# Patient Record
Sex: Male | Born: 1975 | Race: White | Hispanic: No | Marital: Married | Smoking: Former smoker
Health system: Southern US, Community
[De-identification: ages and names within clinical notes are randomized; demographics above are authoritative.]

---

## 1998-12-17 ENCOUNTER — Encounter: Payer: Self-pay | Admitting: Specialist

## 1998-12-17 ENCOUNTER — Ambulatory Visit (HOSPITAL_COMMUNITY): Admission: RE | Admit: 1998-12-17 | Discharge: 1998-12-17 | Payer: Self-pay | Admitting: Specialist

## 2012-10-22 ENCOUNTER — Other Ambulatory Visit: Payer: Self-pay | Admitting: Family Medicine

## 2012-10-22 DIAGNOSIS — N508 Other specified disorders of male genital organs: Secondary | ICD-10-CM

## 2012-10-29 ENCOUNTER — Other Ambulatory Visit: Payer: Self-pay

## 2012-10-29 ENCOUNTER — Ambulatory Visit
Admission: RE | Admit: 2012-10-29 | Discharge: 2012-10-29 | Disposition: A | Discharge status: Home / Self Care | Payer: BC Managed Care – PPO | Source: Ambulatory Visit | Attending: Family Medicine | Admitting: Family Medicine

## 2012-10-29 DIAGNOSIS — N508 Other specified disorders of male genital organs: Secondary | ICD-10-CM

## 2013-08-27 IMAGING — US US SCROTUM
1 series · 14 of 25 positions shown · non-contrast
Comparison: none

Scrotal ultrasound
HISTORY: Palpable fullness right scrotum

[Series 1: us scrotum · 0.10mm/px · 14 of 37 slices shown]
[im 1/37]
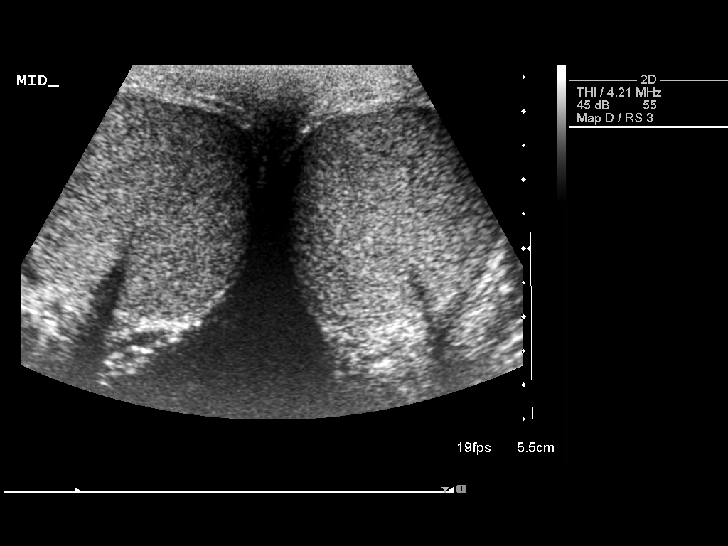
[im 4/37]
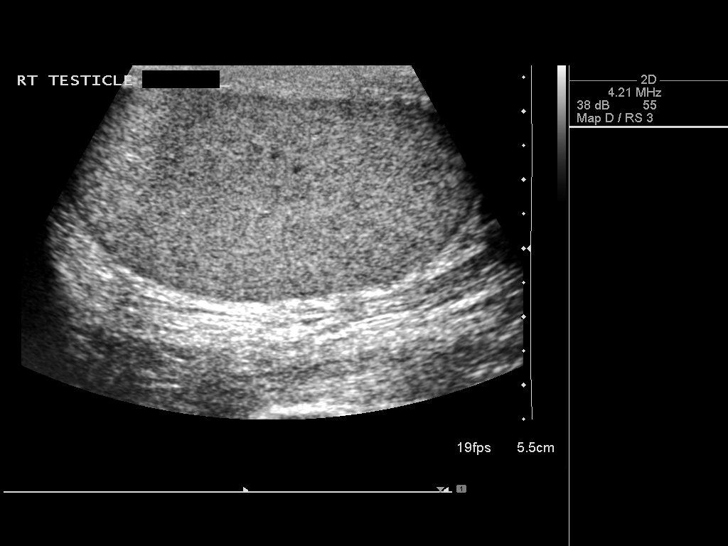
[im 7/37]
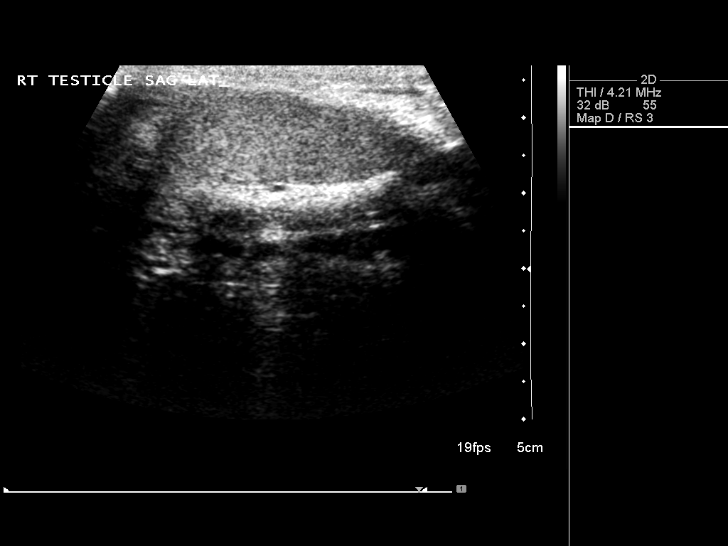
[im 10/37]
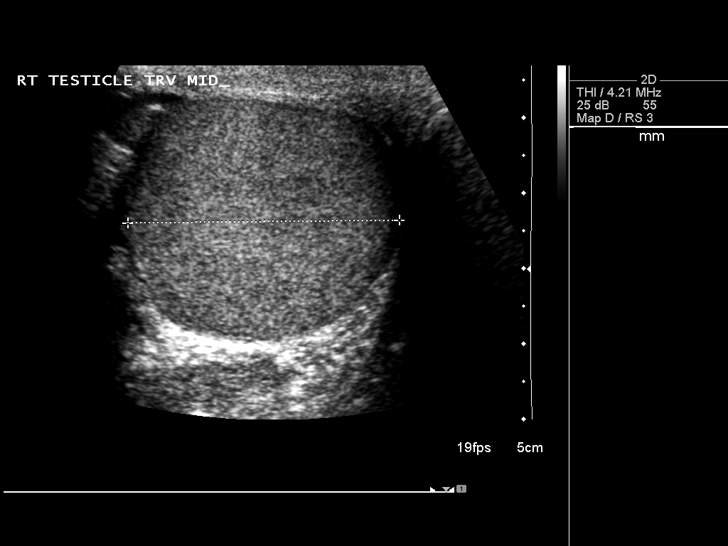
[im 13/37]
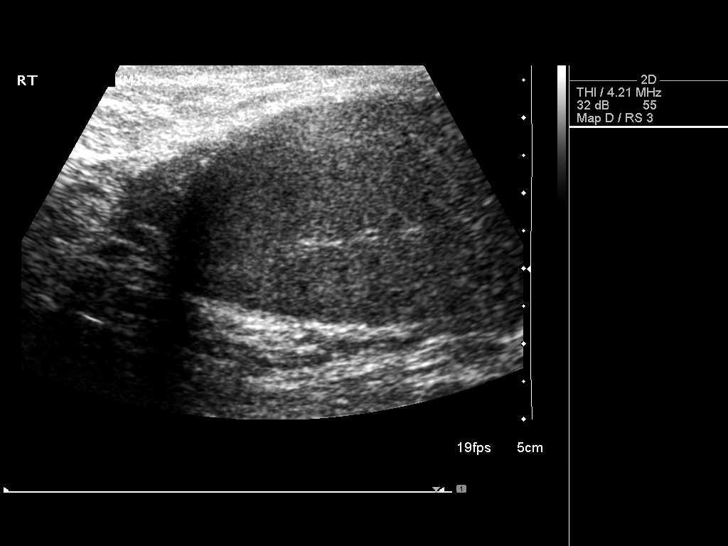
[im 14/37]
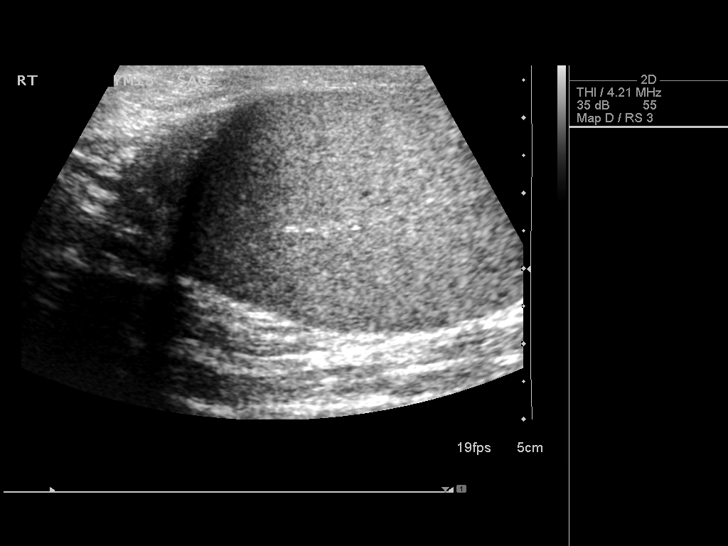
[im 17/37]
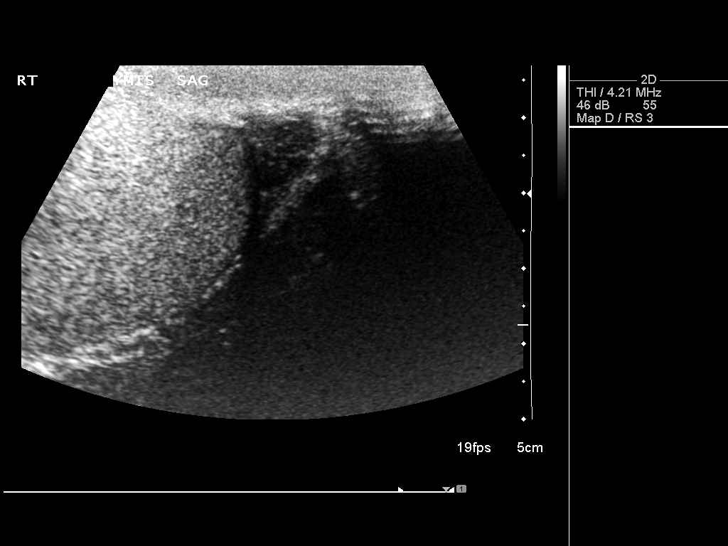
[im 20/37]
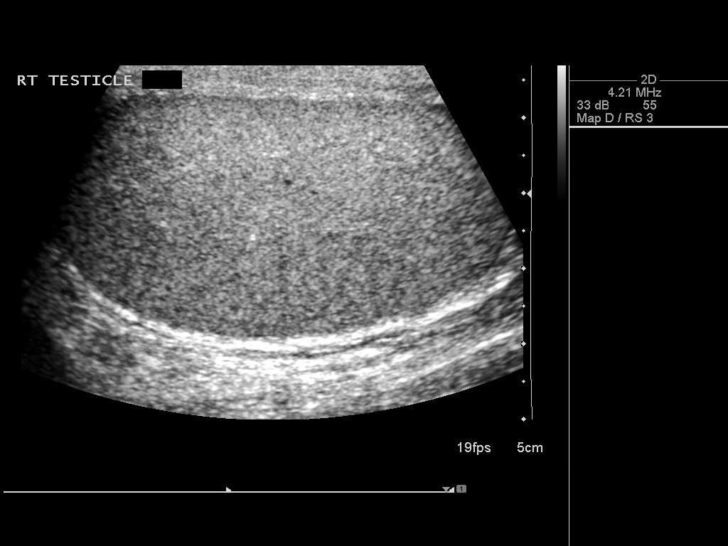
[im 23/37]
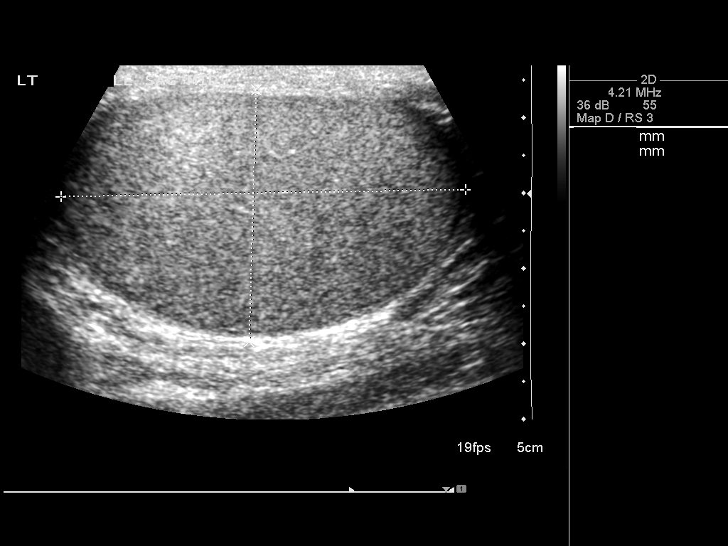
[im 25/37]
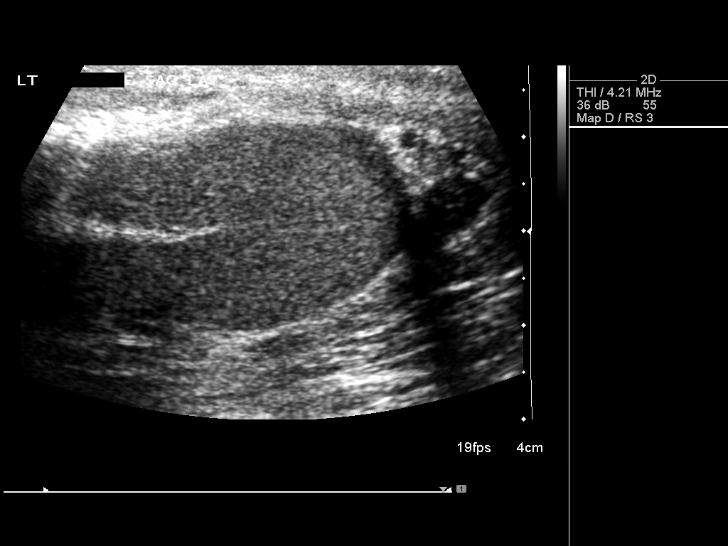
[im 28/37]
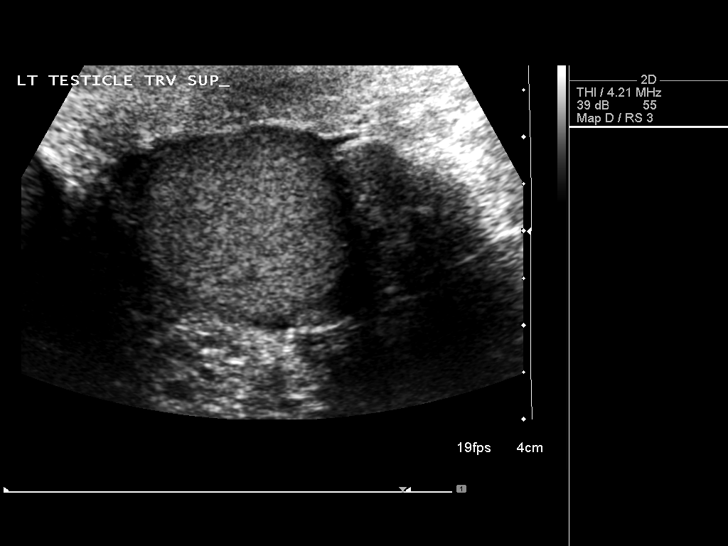
[im 31/37]
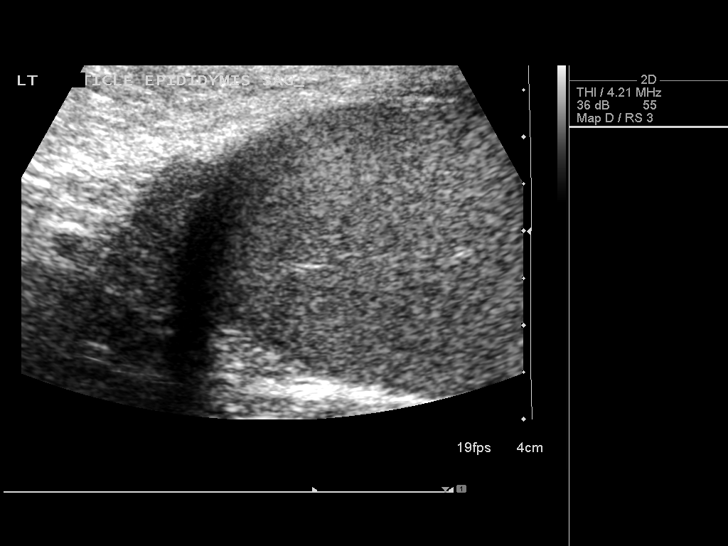
[im 34/37]
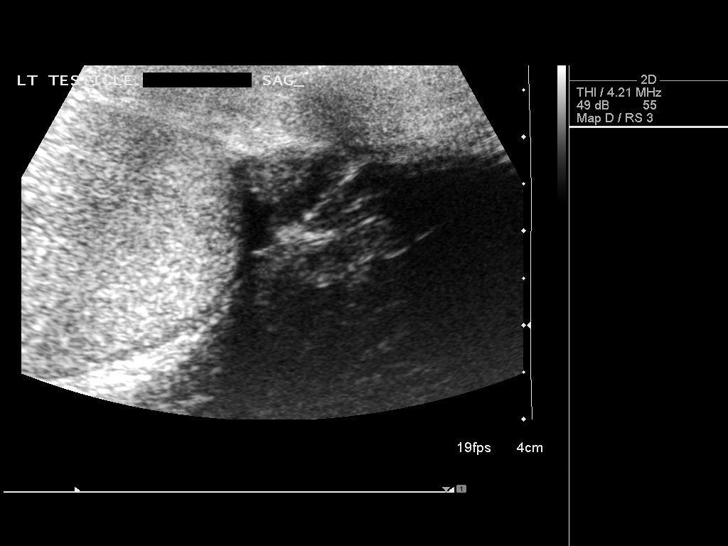
[im 37/37]
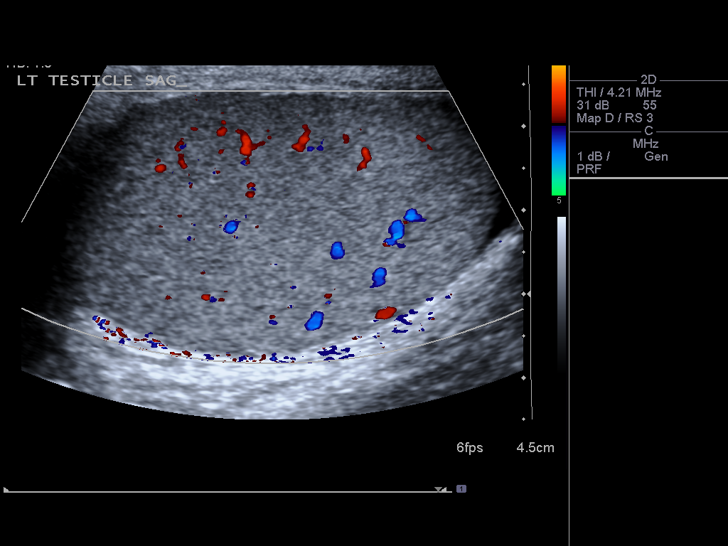

[14 of 25 positions shown; findings below may reference images not displayed]

FINDINGS: Both testes are normal in size and contour.  There is no
intratesticular mass.  Color flow is seen in both testes.

Epididymal structures appear symmetric and normal bilaterally.
There are no extratesticular masses on either side.  There are no
hydroceles.  No varicoceles are demonstrated.  There is no scrotal
abscess or wall thickening.
CONCLUSION: Normal study.  In particular, there is no
intratesticular or extratesticular mass visualized by ultrasound on
either side.  No evidence of testicular torsion on either side.

## 2018-05-14 ENCOUNTER — Emergency Department (HOSPITAL_COMMUNITY)
Admission: EM | Admit: 2018-05-14 | Discharge: 2018-05-15 | Disposition: A | Discharge status: Home / Self Care | Payer: Self-pay | Attending: Emergency Medicine | Admitting: Emergency Medicine

## 2018-05-14 ENCOUNTER — Encounter (HOSPITAL_COMMUNITY): Payer: Self-pay | Admitting: Emergency Medicine

## 2018-05-14 DIAGNOSIS — Y998 Other external cause status: Secondary | ICD-10-CM | POA: Insufficient documentation

## 2018-05-14 DIAGNOSIS — Y92007 Garden or yard of unspecified non-institutional (private) residence as the place of occurrence of the external cause: Secondary | ICD-10-CM | POA: Insufficient documentation

## 2018-05-14 DIAGNOSIS — W5911XA Bitten by nonvenomous snake, initial encounter: Secondary | ICD-10-CM | POA: Insufficient documentation

## 2018-05-14 DIAGNOSIS — Y9301 Activity, walking, marching and hiking: Secondary | ICD-10-CM | POA: Insufficient documentation

## 2018-05-14 DIAGNOSIS — S91351A Open bite, right foot, initial encounter: Secondary | ICD-10-CM | POA: Insufficient documentation

## 2018-05-14 LAB — CBC WITH DIFFERENTIAL/PLATELET
Abs Immature Granulocytes: 0 10*3/uL (ref 0.0–0.1)
BASOS ABS: 0 10*3/uL (ref 0.0–0.1)
Basophils Relative: 0 %
EOS PCT: 2 %
Eosinophils Absolute: 0.1 10*3/uL (ref 0.0–0.7)
HCT: 47.4 % (ref 39.0–52.0)
HEMOGLOBIN: 15.2 g/dL (ref 13.0–17.0)
Immature Granulocytes: 0 %
Lymphocytes Relative: 26 %
Lymphs Abs: 2 10*3/uL (ref 0.7–4.0)
MCH: 29.9 pg (ref 26.0–34.0)
MCHC: 32.1 g/dL (ref 30.0–36.0)
MCV: 93.1 fL (ref 78.0–100.0)
Monocytes Absolute: 0.8 10*3/uL (ref 0.1–1.0)
Monocytes Relative: 10 %
NEUTROS ABS: 4.8 10*3/uL (ref 1.7–7.7)
Neutrophils Relative %: 62 %
PLATELETS: 242 10*3/uL (ref 150–400)
RBC: 5.09 MIL/uL (ref 4.22–5.81)
RDW: 11.8 % (ref 11.5–15.5)
WBC: 7.8 10*3/uL (ref 4.0–10.5)

## 2018-05-14 LAB — BASIC METABOLIC PANEL
Anion gap: 10 (ref 5–15)
BUN: 15 mg/dL (ref 6–20)
CO2: 25 mmol/L (ref 22–32)
Calcium: 9.7 mg/dL (ref 8.9–10.3)
Chloride: 104 mmol/L (ref 98–111)
Creatinine, Ser: 0.94 mg/dL (ref 0.61–1.24)
GFR calc Af Amer: >60 mL/min (ref >60)
GFR calc non Af Amer: >60 mL/min (ref >60)
Glucose, Bld: 108 mg/dL — ABNORMAL HIGH (ref 70–99)
Potassium: 3.9 mmol/L (ref 3.5–5.1)
Sodium: 139 mmol/L (ref 135–145)

## 2018-05-14 LAB — PROTIME-INR
INR: 0.94
PROTHROMBIN TIME: 12.5 s (ref 11.4–15.2)

## 2018-05-14 LAB — FIBRINOGEN: Fibrinogen: 386 mg/dL (ref 210–475)

## 2018-05-14 NOTE — ED Triage Notes (Signed)
Pt reports 6-7 hrs ago he was carrying something in his yard when he felt something "sting" on his foot. This evening pt was laying on couch and propped his feet up when he noted 2 marks on top of his R foot. Pt thinks he was bit by a snake. Mild redness noted to top of foot, no swelling, no bruising.

## 2018-05-14 NOTE — ED Provider Notes (Signed)
[facility] EMERGENCY DEPARTMENT Provider Note   CSN: [id number] Arrival date & time: 05/14/18  2109     History   Chief Complaint Chief Complaint  Patient presents with  . Snake Bite    HPI CINSERE MIZRAHI is a 42 y.o. male.  HPI Patient presents with concern of a lesion on his right proximal dorsal foot. Patient recalls walking from his house, carrying something so he was unable to see his foot, when he felt sudden onset of pain, stinging sensation and something attached to his foot. He shook it off, but several hours later noticed a pair of wounds on the dorsal surface, with some erythema, some swelling. No associated fever, vomiting, nausea, loss of sensation distally. With concern for snakebite he presents for evaluation.  History reviewed. No pertinent past medical history.  There are no active problems to display for this patient.   History reviewed. No pertinent surgical history.      Home Medications    Prior to Admission medications   Not on File    Family History No family history on file.  Social History Social History   Tobacco Use  . Smoking status: Not on file  . Smokeless tobacco: Never Used  Substance Use Topics  . Alcohol use: Yes    Comment: Socially   . Drug use: Not Currently     Allergies   Patient has no known allergies.   Review of Systems Review of Systems  Constitutional: Negative for fever.  Gastrointestinal: Negative for nausea and vomiting.  Musculoskeletal:       Negative aside from HPI  Skin:       Negative aside from HPI  Allergic/Immunologic: Negative for immunocompromised state.  Neurological: Negative for weakness.     Physical Exam Updated Vital Signs BP (!) 147/114 (BP Location: Right Arm)   Pulse (!) 111   Temp 98.9 F (37.2 C) (Oral)   Resp 16   Ht 6' (1.829 m)   Wt 122.5 kg (270 lb)   SpO2 98%   BMI 36.62 kg/m   Physical Exam  Constitutional: He is oriented to person,  place, and time. He appears well-developed. No distress.  HENT:  Head: Normocephalic and atraumatic.  Eyes: Conjunctivae and EOM are normal.  Cardiovascular: Normal rate, regular rhythm and intact distal pulses.  Pulmonary/Chest: Effort normal. No stridor. No respiratory distress.  Abdominal: He exhibits no distension.  Musculoskeletal: He exhibits no edema.       Feet:  Neurological: He is alert and oriented to person, place, and time.  Skin: Skin is warm and dry.     Psychiatric: He has a normal mood and affect.  Nursing note and vitals reviewed.    ED Treatments / Results  Labs (all labs ordered are listed, but only abnormal results are displayed) Labs Reviewed  BASIC METABOLIC PANEL  CBC WITH DIFFERENTIAL/PLATELET  CBC WITH DIFFERENTIAL/PLATELET  CBC WITH DIFFERENTIAL/PLATELET  PROTIME-INR  PROTIME-INR  PROTIME-INR  FIBRINOGEN  FIBRINOGEN  FIBRINOGEN    EKG None  Radiology No results found.  Procedures Procedures (including critical care time)  Medications Ordered in ED Medications - No data to display   Initial Impression / Assessment and Plan / ED Course  I have reviewed the triage vital signs and the nursing notes.  Pertinent labs & imaging results that were available during my care of the patient were reviewed by me and considered in my medical decision making (see chart for details).  On repeat exam  the patient is in no distress, labs discussed. Patient's lotion here reassuring, given the passage of approximately 8 hours since the wound itself, without any substantial spread of the lesion, low suspicion for substantial envenomation if snake wound is the actual etiology. With reassuring labs, vitals, patient was discharged in stable condition.  Final Clinical Impressions(s) / ED Diagnoses  Alyson LocketSnakebite   Deangela Randleman, MD 05/14/18 2328

## 2018-05-14 NOTE — ED Notes (Signed)
RN attempted to start IV x2 without success.  

## 2018-05-14 NOTE — Discharge Instructions (Addendum)
As discussed, your evaluation today has been largely reassuring.  But, it is important that you monitor your condition carefully, and do not hesitate to return to the ED if you develop new, or concerning changes in your condition. ? ?Otherwise, please follow-up with your physician for appropriate ongoing care. ? ?

## 2019-09-12 ENCOUNTER — Ambulatory Visit (INDEPENDENT_AMBULATORY_CARE_PROVIDER_SITE_OTHER): Payer: 59 | Admitting: Psychiatry

## 2019-09-12 ENCOUNTER — Encounter: Payer: Self-pay | Admitting: Psychiatry

## 2019-09-12 ENCOUNTER — Other Ambulatory Visit: Payer: Self-pay

## 2019-09-12 DIAGNOSIS — F411 Generalized anxiety disorder: Secondary | ICD-10-CM | POA: Diagnosis not present
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
## 2019-10-13 ENCOUNTER — Ambulatory Visit: Payer: 59 | Admitting: Psychiatry

## 2019-10-29 ENCOUNTER — Ambulatory Visit: Payer: 59 | Admitting: Psychiatry

## 2019-11-12 ENCOUNTER — Other Ambulatory Visit: Payer: Self-pay

## 2019-11-12 ENCOUNTER — Ambulatory Visit (INDEPENDENT_AMBULATORY_CARE_PROVIDER_SITE_OTHER): Payer: 59 | Admitting: Psychiatry

## 2019-11-12 ENCOUNTER — Encounter: Payer: Self-pay | Admitting: Psychiatry

## 2019-11-12 DIAGNOSIS — F411 Generalized anxiety disorder: Secondary | ICD-10-CM | POA: Diagnosis not present

## 2019-11-12 NOTE — Progress Notes (Signed)
      [facility] Counselor/Therapist Progress Note  Patient ID: Hector Walker, MRN: [id number],    Date: 11/12/2019  Time Spent: 50 minutes   Treatment Type: Individual Therapy  Reported Symptoms: anxious  Mental Status Exam:  Appearance:   Casual     Behavior:  Appropriate  Motor:  Normal  Speech/Language:   Clear and Coherent  Affect:  Appropriate  Mood:  anxious  Thought process:  normal  Thought content:    WNL  Sensory/Perceptual disturbances:    WNL  Orientation:  oriented to person, place, time/date and situation  Attention:  Good  Concentration:  Good  Memory:  WNL  Fund of knowledge:   Good  Insight:    Good  Judgment:   Good  Impulse Control:  Good   Risk Assessment: Danger to Self:  No Self-injurious Behavior: No Danger to Others: No Duty to Warn:no Physical Aggression / Violence:No  Access to Firearms a concern: No  Gang Involvement:No   Subjective: The client states he had to cancel his last appointment because he contracted the COVID-19 virus.  He stated he has been sick for more than a week.  He had made progress on his goals of losing weight.  He also was able to fix the situation with his children school.  They are now attending [employer]. Today the client discussed that a friend of his was having an emotional affair.  He has advised this friend not to do so.  He is concerned that it will blow up in his face.  Most recently ,at his encouragement his wife has been developing more male relationships.  She makes the effort to go out and meet with her girlfriends.  With his friends affair and his wife meeting her friends the client has become insecure.  Today we used eye-movement around this insecurity with his wife.  His subjective units of distress was a 4.  As the client processed I suggested that he directly discussed with her his fears and concerns.  Overall he describes a healthy relationship with his wife.  I explained to him that  being vulnerable with her we will give him much more positive interaction.  He agreed his subjective units of distress at the end of the session was less than 2.  Client also realized that he has too much time on his hands to overthink things.  He will work on engaging in new activities and playing his guitar more.  Interventions: Assertiveness/Communication, Mindfulness Meditation, Solution-Oriented/Positive Psychology, [employer] Desensitization and Reprocessing (EMDR) and Insight-Oriented  Diagnosis:   ICD-10-CM   1. Generalized anxiety disorder  F41.1     Plan: Positive self talk, discussion with wife, assertiveness, boundaries, self-care, engaged activities such as playing his guitar.  Nash Bolls, [facility]

## 2019-12-10 ENCOUNTER — Ambulatory Visit: Payer: 59 | Admitting: Psychiatry

## 2020-01-01 ENCOUNTER — Ambulatory Visit: Payer: 59 | Admitting: Psychiatry

## 2020-01-28 ENCOUNTER — Other Ambulatory Visit: Payer: Self-pay

## 2020-01-28 ENCOUNTER — Encounter: Payer: Self-pay | Admitting: Psychiatry

## 2020-01-28 ENCOUNTER — Ambulatory Visit (INDEPENDENT_AMBULATORY_CARE_PROVIDER_SITE_OTHER): Payer: 59 | Admitting: Psychiatry

## 2020-01-28 DIAGNOSIS — F411 Generalized anxiety disorder: Secondary | ICD-10-CM | POA: Diagnosis not present
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
## 2020-02-26 ENCOUNTER — Ambulatory Visit: Payer: 59 | Admitting: Psychiatry

## 2022-05-23 ENCOUNTER — Other Ambulatory Visit: Payer: Self-pay | Admitting: Registered Nurse

## 2022-05-23 DIAGNOSIS — E7800 Pure hypercholesterolemia, unspecified: Secondary | ICD-10-CM

## 2022-07-27 ENCOUNTER — Ambulatory Visit
Admission: RE | Admit: 2022-07-27 | Discharge: 2022-07-27 | Disposition: A | Discharge status: Home / Self Care | Payer: No Typology Code available for payment source | Source: Ambulatory Visit | Attending: Registered Nurse | Admitting: Registered Nurse

## 2022-07-27 DIAGNOSIS — E7800 Pure hypercholesterolemia, unspecified: Secondary | ICD-10-CM

## 2023-01-18 DIAGNOSIS — Z79890 Hormone replacement therapy (postmenopausal): Secondary | ICD-10-CM | POA: Diagnosis not present

## 2023-01-18 DIAGNOSIS — R5383 Other fatigue: Secondary | ICD-10-CM | POA: Diagnosis not present

## 2023-01-18 DIAGNOSIS — E291 Testicular hypofunction: Secondary | ICD-10-CM | POA: Diagnosis not present

## 2023-01-25 DIAGNOSIS — E291 Testicular hypofunction: Secondary | ICD-10-CM | POA: Diagnosis not present

## 2023-01-25 DIAGNOSIS — Z6834 Body mass index (BMI) 34.0-34.9, adult: Secondary | ICD-10-CM | POA: Diagnosis not present

## 2023-01-25 DIAGNOSIS — M2550 Pain in unspecified joint: Secondary | ICD-10-CM | POA: Diagnosis not present

## 2023-01-25 DIAGNOSIS — F419 Anxiety disorder, unspecified: Secondary | ICD-10-CM | POA: Diagnosis not present

## 2023-04-19 DIAGNOSIS — E291 Testicular hypofunction: Secondary | ICD-10-CM | POA: Diagnosis not present

## 2023-04-24 ENCOUNTER — Emergency Department (HOSPITAL_BASED_OUTPATIENT_CLINIC_OR_DEPARTMENT_OTHER): Payer: BC Managed Care – PPO

## 2023-04-24 ENCOUNTER — Other Ambulatory Visit: Payer: Self-pay

## 2023-04-24 ENCOUNTER — Emergency Department (HOSPITAL_BASED_OUTPATIENT_CLINIC_OR_DEPARTMENT_OTHER)
Admission: EM | Admit: 2023-04-24 | Discharge: 2023-04-24 | Disposition: A | Discharge status: Home / Self Care | Payer: BC Managed Care – PPO | Attending: Emergency Medicine | Admitting: Emergency Medicine

## 2023-04-24 ENCOUNTER — Encounter (HOSPITAL_BASED_OUTPATIENT_CLINIC_OR_DEPARTMENT_OTHER): Payer: Self-pay

## 2023-04-24 DIAGNOSIS — S161XXA Strain of muscle, fascia and tendon at neck level, initial encounter: Secondary | ICD-10-CM | POA: Insufficient documentation

## 2023-04-24 DIAGNOSIS — S199XXA Unspecified injury of neck, initial encounter: Secondary | ICD-10-CM | POA: Diagnosis not present

## 2023-04-24 DIAGNOSIS — Y92410 Unspecified street and highway as the place of occurrence of the external cause: Secondary | ICD-10-CM | POA: Diagnosis not present

## 2023-04-24 DIAGNOSIS — M546 Pain in thoracic spine: Secondary | ICD-10-CM | POA: Insufficient documentation

## 2023-04-24 MED ORDER — IBUPROFEN 800 MG PO TABS
800.0000 mg | ORAL_TABLET | Freq: Once | ORAL | Status: AC
  Administered 2023-04-24: 800 mg via ORAL
  Filled 2023-04-24: qty 1

## 2023-04-24 MED ORDER — CYCLOBENZAPRINE HCL 10 MG PO TABS
10.0000 mg | ORAL_TABLET | Freq: Once | ORAL | Status: AC
  Administered 2023-04-24: 10 mg via ORAL
  Filled 2023-04-24: qty 1

## 2023-04-24 MED ORDER — METHOCARBAMOL 500 MG PO TABS: 500.0000 mg | ORAL_TABLET | Freq: Two times a day (BID) | ORAL | 0 refills | Status: AC

## 2023-04-24 MED ORDER — HYDROCODONE-ACETAMINOPHEN 5-325 MG PO TABS
1.0000 | ORAL_TABLET | Freq: Once | ORAL | Status: DC
  Filled 2023-04-24: qty 1

## 2023-04-24 MED ORDER — ACETAMINOPHEN 325 MG PO TABS
650.0000 mg | ORAL_TABLET | Freq: Once | ORAL | Status: AC
  Administered 2023-04-24: 650 mg via ORAL
  Filled 2023-04-24: qty 2
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
## 2023-04-24 NOTE — ED Notes (Signed)
RN reviewed discharge instructions with pt. Pt verbalized understanding and had no further questions. VSS upon discharge.  

## 2023-04-24 NOTE — Discharge Instructions (Signed)
Motrin and Tylenol as needed as directed. Robaxin as needed as prescribed for muscle spasm. Do NOT drive or operate machinery while taking this medication.   Warm compresses to sore muscles, gentle exercises as discussed.  Recheck with your PCP if not improving the next 3 to 5 days.
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
## 2023-05-02 DIAGNOSIS — Z79890 Hormone replacement therapy (postmenopausal): Secondary | ICD-10-CM | POA: Diagnosis not present

## 2023-05-02 DIAGNOSIS — E291 Testicular hypofunction: Secondary | ICD-10-CM | POA: Diagnosis not present

## 2023-05-07 DIAGNOSIS — E291 Testicular hypofunction: Secondary | ICD-10-CM | POA: Diagnosis not present

## 2023-05-07 DIAGNOSIS — Z6834 Body mass index (BMI) 34.0-34.9, adult: Secondary | ICD-10-CM | POA: Diagnosis not present

## 2023-05-07 DIAGNOSIS — R5383 Other fatigue: Secondary | ICD-10-CM | POA: Diagnosis not present

## 2023-05-07 DIAGNOSIS — I10 Essential (primary) hypertension: Secondary | ICD-10-CM | POA: Diagnosis not present

## 2023-05-22 DIAGNOSIS — I10 Essential (primary) hypertension: Secondary | ICD-10-CM | POA: Diagnosis not present

## 2023-05-22 DIAGNOSIS — E559 Vitamin D deficiency, unspecified: Secondary | ICD-10-CM | POA: Diagnosis not present

## 2023-05-22 DIAGNOSIS — E7800 Pure hypercholesterolemia, unspecified: Secondary | ICD-10-CM | POA: Diagnosis not present

## 2023-05-22 DIAGNOSIS — R5383 Other fatigue: Secondary | ICD-10-CM | POA: Diagnosis not present

## 2023-05-22 DIAGNOSIS — Z0000 Encounter for general adult medical examination without abnormal findings: Secondary | ICD-10-CM | POA: Diagnosis not present

## 2023-05-29 DIAGNOSIS — Z0000 Encounter for general adult medical examination without abnormal findings: Secondary | ICD-10-CM | POA: Diagnosis not present

## 2023-08-15 DIAGNOSIS — R300 Dysuria: Secondary | ICD-10-CM | POA: Diagnosis not present

## 2023-08-15 DIAGNOSIS — R1031 Right lower quadrant pain: Secondary | ICD-10-CM | POA: Diagnosis not present

## 2023-08-16 ENCOUNTER — Other Ambulatory Visit: Payer: Self-pay | Admitting: Registered Nurse

## 2023-08-16 DIAGNOSIS — R300 Dysuria: Secondary | ICD-10-CM

## 2023-08-16 DIAGNOSIS — R110 Nausea: Secondary | ICD-10-CM

## 2023-08-16 DIAGNOSIS — R1031 Right lower quadrant pain: Secondary | ICD-10-CM

## 2023-08-16 DIAGNOSIS — R140 Abdominal distension (gaseous): Secondary | ICD-10-CM

## 2023-08-31 DIAGNOSIS — D128 Benign neoplasm of rectum: Secondary | ICD-10-CM | POA: Diagnosis not present

## 2023-08-31 DIAGNOSIS — Z1211 Encounter for screening for malignant neoplasm of colon: Secondary | ICD-10-CM | POA: Diagnosis not present

## 2023-08-31 DIAGNOSIS — D124 Benign neoplasm of descending colon: Secondary | ICD-10-CM | POA: Diagnosis not present

## 2023-09-07 ENCOUNTER — Other Ambulatory Visit: Payer: Self-pay | Admitting: Registered Nurse

## 2023-09-07 DIAGNOSIS — R110 Nausea: Secondary | ICD-10-CM

## 2023-09-07 DIAGNOSIS — R1031 Right lower quadrant pain: Secondary | ICD-10-CM

## 2023-09-07 DIAGNOSIS — R300 Dysuria: Secondary | ICD-10-CM

## 2023-09-07 DIAGNOSIS — R140 Abdominal distension (gaseous): Secondary | ICD-10-CM

## 2023-09-13 ENCOUNTER — Ambulatory Visit
Admission: RE | Admit: 2023-09-13 | Discharge: 2023-09-13 | Disposition: A | Discharge status: Home / Self Care | Payer: 59 | Source: Ambulatory Visit | Attending: Registered Nurse | Admitting: Registered Nurse

## 2023-09-13 DIAGNOSIS — R1031 Right lower quadrant pain: Secondary | ICD-10-CM

## 2023-09-13 DIAGNOSIS — R300 Dysuria: Secondary | ICD-10-CM

## 2023-09-13 DIAGNOSIS — R140 Abdominal distension (gaseous): Secondary | ICD-10-CM

## 2023-09-13 DIAGNOSIS — R110 Nausea: Secondary | ICD-10-CM

## 2023-10-25 DIAGNOSIS — Z125 Encounter for screening for malignant neoplasm of prostate: Secondary | ICD-10-CM | POA: Diagnosis not present

## 2023-10-25 DIAGNOSIS — E349 Endocrine disorder, unspecified: Secondary | ICD-10-CM | POA: Diagnosis not present

## 2023-11-01 DIAGNOSIS — E291 Testicular hypofunction: Secondary | ICD-10-CM | POA: Diagnosis not present

## 2023-11-01 DIAGNOSIS — N5201 Erectile dysfunction due to arterial insufficiency: Secondary | ICD-10-CM | POA: Diagnosis not present

## 2023-11-14 DIAGNOSIS — R52 Pain, unspecified: Secondary | ICD-10-CM | POA: Diagnosis not present

## 2023-11-14 DIAGNOSIS — R509 Fever, unspecified: Secondary | ICD-10-CM | POA: Diagnosis not present

## 2023-11-14 DIAGNOSIS — R051 Acute cough: Secondary | ICD-10-CM | POA: Diagnosis not present

## 2023-11-14 DIAGNOSIS — J101 Influenza due to other identified influenza virus with other respiratory manifestations: Secondary | ICD-10-CM | POA: Diagnosis not present

## 2023-12-25 DIAGNOSIS — M25521 Pain in right elbow: Secondary | ICD-10-CM | POA: Diagnosis not present

## 2024-02-15 DIAGNOSIS — E291 Testicular hypofunction: Secondary | ICD-10-CM | POA: Diagnosis not present

## 2024-05-23 DIAGNOSIS — L723 Sebaceous cyst: Secondary | ICD-10-CM | POA: Diagnosis not present

## 2024-05-23 DIAGNOSIS — L738 Other specified follicular disorders: Secondary | ICD-10-CM | POA: Diagnosis not present

## 2024-05-23 DIAGNOSIS — Q828 Other specified congenital malformations of skin: Secondary | ICD-10-CM | POA: Diagnosis not present

## 2024-05-30 DIAGNOSIS — Z0000 Encounter for general adult medical examination without abnormal findings: Secondary | ICD-10-CM | POA: Diagnosis not present

## 2024-05-30 DIAGNOSIS — E559 Vitamin D deficiency, unspecified: Secondary | ICD-10-CM | POA: Diagnosis not present

## 2024-05-30 DIAGNOSIS — E7800 Pure hypercholesterolemia, unspecified: Secondary | ICD-10-CM | POA: Diagnosis not present

## 2024-05-30 DIAGNOSIS — R5383 Other fatigue: Secondary | ICD-10-CM | POA: Diagnosis not present

## 2024-05-30 DIAGNOSIS — I10 Essential (primary) hypertension: Secondary | ICD-10-CM | POA: Diagnosis not present

## 2024-06-03 DIAGNOSIS — N529 Male erectile dysfunction, unspecified: Secondary | ICD-10-CM | POA: Diagnosis not present

## 2024-06-03 DIAGNOSIS — Z0000 Encounter for general adult medical examination without abnormal findings: Secondary | ICD-10-CM | POA: Diagnosis not present

## 2024-06-03 DIAGNOSIS — I10 Essential (primary) hypertension: Secondary | ICD-10-CM | POA: Diagnosis not present

## 2024-06-06 DIAGNOSIS — R5383 Other fatigue: Secondary | ICD-10-CM | POA: Diagnosis not present

## 2024-06-06 DIAGNOSIS — Z125 Encounter for screening for malignant neoplasm of prostate: Secondary | ICD-10-CM | POA: Diagnosis not present

## 2024-09-24 DIAGNOSIS — L720 Epidermal cyst: Secondary | ICD-10-CM | POA: Diagnosis not present

## 2024-09-24 DIAGNOSIS — D485 Neoplasm of uncertain behavior of skin: Secondary | ICD-10-CM | POA: Diagnosis not present
